# Patient Record
Sex: Female | Born: 1935 | Race: White | Smoking: Never smoker
Health system: Southern US, Community
[De-identification: ages and names within clinical notes are randomized; demographics above are authoritative.]

## PROBLEM LIST (undated history)

## (undated) DIAGNOSIS — E538 Deficiency of other specified B group vitamins: Secondary | ICD-10-CM

## (undated) DIAGNOSIS — M1990 Unspecified osteoarthritis, unspecified site: Secondary | ICD-10-CM

## (undated) DIAGNOSIS — N200 Calculus of kidney: Secondary | ICD-10-CM

## (undated) DIAGNOSIS — K559 Vascular disorder of intestine, unspecified: Secondary | ICD-10-CM

## (undated) DIAGNOSIS — F329 Major depressive disorder, single episode, unspecified: Secondary | ICD-10-CM

## (undated) DIAGNOSIS — R011 Cardiac murmur, unspecified: Secondary | ICD-10-CM

## (undated) DIAGNOSIS — I10 Essential (primary) hypertension: Secondary | ICD-10-CM

## (undated) DIAGNOSIS — E039 Hypothyroidism, unspecified: Secondary | ICD-10-CM

## (undated) DIAGNOSIS — K589 Irritable bowel syndrome without diarrhea: Secondary | ICD-10-CM

## (undated) DIAGNOSIS — E785 Hyperlipidemia, unspecified: Secondary | ICD-10-CM

## (undated) DIAGNOSIS — K219 Gastro-esophageal reflux disease without esophagitis: Secondary | ICD-10-CM

## (undated) DIAGNOSIS — F32A Depression, unspecified: Secondary | ICD-10-CM

## (undated) DIAGNOSIS — M7120 Synovial cyst of popliteal space [Baker], unspecified knee: Secondary | ICD-10-CM

## (undated) DIAGNOSIS — E559 Vitamin D deficiency, unspecified: Secondary | ICD-10-CM

## (undated) DIAGNOSIS — D126 Benign neoplasm of colon, unspecified: Secondary | ICD-10-CM

## (undated) DIAGNOSIS — G4700 Insomnia, unspecified: Secondary | ICD-10-CM

## (undated) DIAGNOSIS — G4730 Sleep apnea, unspecified: Secondary | ICD-10-CM

## (undated) DIAGNOSIS — M51369 Other intervertebral disc degeneration, lumbar region without mention of lumbar back pain or lower extremity pain: Secondary | ICD-10-CM

## (undated) DIAGNOSIS — M5136 Other intervertebral disc degeneration, lumbar region: Secondary | ICD-10-CM

## (undated) DIAGNOSIS — I639 Cerebral infarction, unspecified: Secondary | ICD-10-CM

## (undated) DIAGNOSIS — F419 Anxiety disorder, unspecified: Secondary | ICD-10-CM

## (undated) HISTORY — DX: Irritable bowel syndrome, unspecified: K58.9

## (undated) HISTORY — DX: Cerebral infarction, unspecified: I63.9

## (undated) HISTORY — DX: Vascular disorder of intestine, unspecified: K55.9

## (undated) HISTORY — DX: Deficiency of other specified B group vitamins: E53.8

## (undated) HISTORY — DX: Cardiac murmur, unspecified: R01.1

## (undated) HISTORY — DX: Calculus of kidney: N20.0

## (undated) HISTORY — DX: Vitamin D deficiency, unspecified: E55.9

## (undated) HISTORY — DX: Essential (primary) hypertension: I10

## (undated) HISTORY — DX: Benign neoplasm of colon, unspecified: D12.6

## (undated) HISTORY — DX: Major depressive disorder, single episode, unspecified: F32.9

## (undated) HISTORY — DX: Gastro-esophageal reflux disease without esophagitis: K21.9

## (undated) HISTORY — DX: Sleep apnea, unspecified: G47.30

## (undated) HISTORY — DX: Depression, unspecified: F32.A

## (undated) HISTORY — DX: Other intervertebral disc degeneration, lumbar region: M51.36

## (undated) HISTORY — DX: Unspecified osteoarthritis, unspecified site: M19.90

## (undated) HISTORY — DX: Synovial cyst of popliteal space (Baker), unspecified knee: M71.20

## (undated) HISTORY — DX: Insomnia, unspecified: G47.00

## (undated) HISTORY — PX: VAGINAL HYSTERECTOMY: SUR661

## (undated) HISTORY — DX: Hypothyroidism, unspecified: E03.9

## (undated) HISTORY — DX: Other intervertebral disc degeneration, lumbar region without mention of lumbar back pain or lower extremity pain: M51.369

## (undated) HISTORY — PX: COLONOSCOPY: SHX174

## (undated) HISTORY — DX: Hyperlipidemia, unspecified: E78.5

## (undated) HISTORY — DX: Anxiety disorder, unspecified: F41.9

## (undated) HISTORY — PX: CHOLECYSTECTOMY: SHX55

## (undated) NOTE — *Deleted (*Deleted)
Objective Swallowing Evaluation: Type of Study: MBS-Modified Barium Swallow Study   Patient Details  Name: Charlene Lopez MRN: [id number] Date of Birth: Mar 01, 1935  Today's Date: 04/07/2020 Time: SLP Start Time (ACUTE ONLY): 1025 -SLP Stop Time (ACUTE ONLY): 1101  SLP Time Calculation (min) (ACUTE ONLY): 36 min   Past Medical History:  Past Medical History:  Diagnosis Date  . Adenomatous colon polyp   . Anxiety   . Baker cyst   . DDD (degenerative disc disease), lumbar   . Depression (emotion)   . GERD (gastroesophageal reflux disease)   . Heart murmur   . Hyperlipidemia   . Hypertension   . Hypothyroidism   . IBS (irritable bowel syndrome)   . Insomnia   . Ischemic colitis (HCC)   . Nephrolithiasis   . Osteoarthritis   . Sleep apnea    Mild- uses CPAP  . Stroke (HCC) [id number]  . Vitamin B12 deficiency   . Vitamin D deficiency    Past Surgical History:  Past Surgical History:  Procedure Laterality Date  . CHOLECYSTECTOMY    . COLONOSCOPY    . PARTIAL COLECTOMY  08/01/2011  . VAGINAL HYSTERECTOMY     HPI: 58 yo female with h/o nonspecific esophageal motility disorder, CVAs in 2016 and 2017, depression, IBS referred by Dr Russella Dar for OP MBSS.  Pt has undergone prior MBS 06/08/2017 showing flash penetration of liquids and functional oropharyngeal swallow ability for her age.  Per Dr Ardell Isaacs office note, pt reports increasing problems with swallowing with liquids causing choking/coughing as well as difficulties swallowing large pills.  Intermittent difficulty with solids per MD note.  Dysphagia symptoms have increased in intensity and frequency in the last year.    Pt states she will "strangle" on  saliva and drink - but states occurs more with saliva.  She also complains of  having retention of phlegm in throat when waking that she can't clear.  Denies weight loss, pnas, nor requiring heimlich   Subjective: pt awake in chair    Assessment / Plan / Recommendation  CHL  IP CLINICAL IMPRESSIONS 04/07/2020  Clinical Impression Pt presents with functional oropharyngeal swallow ability without aspiration or penetration of any consistency tested with head neutral position.  Chin tuck posture did allow aspiration of thin from retained thin on superior side of epiglottis spilling into open airway.  This trace aspiration did produce a reflexive cough.  Pharyngeal swallow is strong without retention with head neutral.  Cervical osteophyte appears present at C3-C4 but did not impair epigllotic deflection.  Given pt report of pill dysphagia but denial of difficulties with pudding - advised she take pills with pudding *start and follow with liquids.  If drinks with slight increased of thickeness prevents cough, advised shde consume them with meals but reviewed importance of hydration/water consumption.  Xerostomia reported thus SLP provided compensations to pt in writing. Thanks for this referral.   SLP Visit Diagnosis Dysphagia, unspecified (R13.10)  Attention and concentration deficit following --  Frontal lobe and executive function deficit following --  Impact on safety and function --      CHL IP TREATMENT RECOMMENDATION 06/08/2017  Treatment Recommendations No treatment recommended at this time     No flowsheet data found.  CHL IP DIET RECOMMENDATION 04/07/2020  SLP Diet Recommendations Regular solids;Thin liquid  Liquid Administration via Cup;Straw  Medication Administration Whole meds with puree  Compensations Slow rate;Small sips/bites  Postural Changes Remain semi-upright after after feeds/meals (Comment);Seated upright at 90 degrees  CHL IP OTHER RECOMMENDATIONS 06/08/2017  Recommended Consults --  Oral Care Recommendations Oral care BID  Other Recommendations --      No flowsheet data found.    No flowsheet data found.         CHL IP ORAL PHASE 04/07/2020  Oral Phase Impaired  Oral - Pudding Teaspoon --  Oral - Pudding Cup --  Oral - Honey  Teaspoon --  Oral - Honey Cup --  Oral - Nectar Teaspoon --  Oral - Nectar Cup WFL  Oral - Nectar Straw WFL  Oral - Thin Teaspoon --  Oral - Thin Cup WFL  Oral - Thin Straw WFL  Oral - Puree WFL  Oral - Mech Soft WFL  Oral - Regular --  Oral - Multi-Consistency --  Oral - Pill --  Oral Phase - Comment --    CHL IP PHARYNGEAL PHASE 04/07/2020  Pharyngeal Phase Impaired  Pharyngeal- Pudding Teaspoon --  Pharyngeal --  Pharyngeal- Pudding Cup --  Pharyngeal --  Pharyngeal- Honey Teaspoon --  Pharyngeal --  Pharyngeal- Honey Cup --  Pharyngeal --  Pharyngeal- Nectar Teaspoon --  Pharyngeal --  Pharyngeal- Nectar Cup [facility]  Pharyngeal Material does not enter airway  Pharyngeal- Nectar Straw WFL  Pharyngeal Material does not enter airway  Pharyngeal- Thin Teaspoon --  Pharyngeal --  Pharyngeal- Thin Cup [facility]  Pharyngeal Material does not enter airway  Pharyngeal- Thin Straw WFL;Penetration/Apiration after swallow  Pharyngeal Material does not enter airway;Material enters airway, passes BELOW cords and not ejected out despite cough attempt by patient  Pharyngeal- Puree WFL  Pharyngeal Material does not enter airway  Pharyngeal- Mechanical Soft WFL  Pharyngeal Material does not enter airway  Pharyngeal- Regular --  Pharyngeal --  Pharyngeal- Multi-consistency --  Pharyngeal --  Pharyngeal- Pill --  Pharyngeal --  Pharyngeal Comment Chin tuck posture tested to see if would possibly decrease episodes of penetration/aspiration however this posture allowed aspiration of thin due to epiglottic retention spill into airway after the swallow.       CHL IP CERVICAL ESOPHAGEAL PHASE 04/07/2020  Cervical Esophageal Phase WFL  Pudding Teaspoon --  Pudding Cup --  Honey Teaspoon --  Honey Cup --  Nectar Teaspoon --  Nectar Cup --  Nectar Straw --  Thin Teaspoon --  Thin Cup --  Thin Straw --  Puree --  Mechanical Soft --  Regular --  Multi-consistency --  Pill --  Cervical  Esophageal Comment --     Charlene Lopez 04/07/2020, 11:23 AM                 Rolena Infante, MS [facility] SLP Acute Rehab Services Office [phone] Pager [phone]

---

## 2011-08-01 HISTORY — PX: PARTIAL COLECTOMY: SHX5273

## 2017-03-23 ENCOUNTER — Encounter: Payer: Self-pay | Admitting: Gastroenterology

## 2017-05-15 ENCOUNTER — Encounter: Payer: Self-pay | Admitting: Gastroenterology

## 2017-05-15 ENCOUNTER — Ambulatory Visit (INDEPENDENT_AMBULATORY_CARE_PROVIDER_SITE_OTHER): Payer: Medicare Other | Admitting: Gastroenterology

## 2017-05-15 ENCOUNTER — Encounter (INDEPENDENT_AMBULATORY_CARE_PROVIDER_SITE_OTHER): Payer: Self-pay

## 2017-05-15 VITALS — BP 122/58 | HR 66 | Ht 69.0 in | Wt 180.1 lb

## 2017-05-15 DIAGNOSIS — R1310 Dysphagia, unspecified: Secondary | ICD-10-CM

## 2017-05-15 DIAGNOSIS — Z7901 Long term (current) use of anticoagulants: Secondary | ICD-10-CM | POA: Diagnosis not present

## 2017-05-15 DIAGNOSIS — Z86010 Personal history of colonic polyps: Secondary | ICD-10-CM | POA: Diagnosis not present

## 2017-05-15 DIAGNOSIS — K580 Irritable bowel syndrome with diarrhea: Secondary | ICD-10-CM

## 2017-05-15 DIAGNOSIS — R197 Diarrhea, unspecified: Secondary | ICD-10-CM

## 2017-05-15 DIAGNOSIS — Z8673 Personal history of transient ischemic attack (TIA), and cerebral infarction without residual deficits: Secondary | ICD-10-CM | POA: Diagnosis not present

## 2017-05-15 MED ORDER — GLYCOPYRROLATE 1 MG PO TABS: 1.0000 mg | ORAL_TABLET | Freq: Two times a day (BID) | ORAL | 3 refills | Status: DC

## 2017-05-15 NOTE — Progress Notes (Signed)
[id number]   /   History of Present Illness: This is an 82 year old female referred by Charlene Mallet, MD for the evaluation of intermittent diarrhea, occasional fecal incontinence, occasional lower abdominal pain and a personal history of colon polyps.  She is accompanied by her daughter.  She previously had GI care in [place] by Dr. West Carbo, Dr. Docia Furl in [place] and more recently by Dr. Kathryne Eriksson in [place].  Reviewed extensive records.  She has a history of adenomatous colon polyps.  One polyp was removed piecemeal from the right colon.  A larger villous adenoma was removed by segmental colectomy in her left colon.  Her most recent colonoscopy was performed in February 2016 with no polyps noted.  Unfortunately she suffered a small CVA following her colonoscopy.  She suffered another CVA about 1 year later and was then placed on Plavix.  She relates problems with irritable bowel syndrome for over 35 years and has normal bowel movements with occasional urgent diarrhea and occasional mild constipation.  She has occasional mild lower abdominal pain.  About 5 times over the past several months she has had fecal incontinence with urgent, watery diarrhea.  She takes Librax as needed, infrequently, which does help her symptoms.  She relates intermittent difficulty swallowing since her CVAs. She chokes occasionally during swallowing, most frequently with liquids.  She localizes the symptoms to the base of her neck. Denies weight loss, change in stool caliber, melena, hematochezia, nausea, vomiting, reflux symptoms, chest pain.   Allergies  Allergen Reactions  . Ciprofloxacin    Outpatient Medications Prior to Visit  Medication Sig Dispense Refill  . amLODipine (NORVASC) 5 MG tablet Take 2.5 mg by mouth daily.     Marland Kitchen atenolol (TENORMIN) 25 MG tablet Take 25 mg by mouth daily.    . Cholecalciferol (VITAMIN D3) 2000 units TABS Take 1 tablet by mouth daily.    . clidinium-chlordiazePOXIDE (LIBRAX)  5-2.5 MG capsule Take 1 capsule by mouth 3 (three) times daily before meals.    . clopidogrel (PLAVIX) 75 MG tablet Take 75 mg by mouth daily.    Marland Kitchen L-Methylfolate-B6-B12 (FOLTANX) 3-35-2 MG TABS Take 1 tablet by mouth 2 (two) times daily.    Marland Kitchen LORazepam (ATIVAN) 0.5 MG tablet Take 0.5 mg by mouth at bedtime.    Marland Kitchen losartan-hydrochlorothiazide (HYZAAR) 100-25 MG tablet Take 1 tablet by mouth daily.    Marland Kitchen PARoxetine (PAXIL) 20 MG tablet Take 20 mg by mouth daily.    . promethazine (PHENERGAN) 25 MG tablet Take 25 mg by mouth every 6 (six) hours as needed for nausea or vomiting.    . simvastatin (ZOCOR) 20 MG tablet Take 20 mg by mouth daily.    . Vitamin D, Ergocalciferol, (DRISDOL) 50000 units CAPS capsule Take 50,000 Units by mouth every 7 (seven) days.    Marland Kitchen aspirin EC 81 MG tablet Take 81 mg by mouth daily.    Marland Kitchen atorvastatin (LIPITOR) 40 MG tablet Take 40 mg by mouth daily.    . baclofen (LIORESAL) 10 MG tablet Take 10 mg by mouth 3 (three) times daily.    . calcium carbonate (OSCAL) 1500 (600 Ca) MG TABS tablet Take 1,500 mg by mouth daily with breakfast.     No facility-administered medications prior to visit.    Past Medical History:  Diagnosis Date  . Adenomatous colon polyp   . Anxiety   . Baker cyst   . DDD (degenerative disc disease), lumbar   . Depression (emotion)   .  GERD (gastroesophageal reflux disease)   . Hyperlipidemia   . Hypertension   . Hypothyroidism   . IBS (irritable bowel syndrome)   . Insomnia   . Ischemic colitis ([place])   . Nephrolithiasis   . Osteoarthritis   . Vitamin B12 deficiency   . Vitamin D deficiency    Past Surgical History:  Procedure Laterality Date  . CHOLECYSTECTOMY    . PARTIAL COLECTOMY  08/01/2011  . VAGINAL HYSTERECTOMY     Social History   Socioeconomic History  . Marital status: Widowed    Spouse name: None  . Number of children: None  . Years of education: None  . Highest education level: None  Social Needs  . Financial  resource strain: None  . Food insecurity - worry: None  . Food insecurity - inability: None  . Transportation needs - medical: None  . Transportation needs - non-medical: None  Occupational History  . None  Tobacco Use  . Smoking status: Never Smoker  . Smokeless tobacco: Never Used  Substance and Sexual Activity  . Alcohol use: No    Frequency: Never  . Drug use: No  . Sexual activity: None  Other Topics Concern  . None  Social History Narrative  . None   Family History  Problem Relation Age of Onset  . Pneumonia Mother   . Diabetes type II Father   . Ovarian cancer Sister   . CVA Sister       Review of Systems: Pertinent positive and negative review of systems were noted in the above HPI section. All other review of systems were otherwise negative.   Physical Exam: General: Well developed, well nourished, no acute distress Head: Normocephalic and atraumatic Eyes:  sclerae anicteric, EOMI Ears: Normal auditory acuity Mouth: No deformity or lesions Neck: Supple, no masses or thyromegaly Lungs: Clear throughout to auscultation Heart: Regular rate and rhythm; no murmurs, rubs or bruits Abdomen: Soft, non tender and non distended. No masses, hepatosplenomegaly or hernias noted. Normal Bowel sounds Rectal: not done Musculoskeletal: Symmetrical with no gross deformities  Skin: No lesions on visible extremities Pulses:  Normal pulses noted Extremities: No clubbing, cyanosis, edema or deformities noted Neurological: Alert oriented x 4, grossly nonfocal Cervical Nodes:  No significant cervical adenopathy Inguinal Nodes: No significant inguinal adenopathy Psychological:  Alert and cooperative. Normal mood and affect  Assessment and Recommendations:  1.  IBS with alternating pattern.  Urgent diarrhea with occasional fecal incontinence is her most bothersome symptom.  Colonoscopy in February 2016 was normal.  Begin glycopyrrolate 1 mg twice daily.  May use Librax 3 times  daily as needed for breakthrough symptoms.  If fecal incontinence persists consider anorectal manometry.  2.  Oropharyngeal favored over esophageal dysphagia.  Schedule modified barium swallow study and barium esophagram with a tablet.  If an esophageal stricture or other significant abnormality on barium esophagram indicates the need for an upper endoscopy with possible biopsy and possible dilation the patient and her daughter will need to further consider the risks-benefits given her need for Plavix and her prior CVAs.  3. Personal history of adenomatous colon polyps one requiring piecemeal polypectomy and one requiring a left segmental colectomy.  Last colonoscopy in 2016 was normal. It is reasonable to consider discontinuing surveillance colonoscopies however given her history of large precancerous polyps it is reasonable to proceed with colonoscopy.  Given her age and history of CVAs the patient and her daughter will need to further consider the risks- benefits given her need  for Plavix. Her previous gastroenterologist had recommended a 3-year interval colonoscopy which would be due in February 2019.  4.  History of CVA maintained on Plavix.  Although a procedure was not scheduled today we  discussed in detail the risk benefits and alternatives to holding Plavix for 5 days prior to the procedure and that we would obtain clearance from her prescribing physician if we proceed with any procedure.   cc: Charlene Mallet, MD [address] [place] [address]

## 2017-05-15 NOTE — Patient Instructions (Signed)
We have sent the following medications to your pharmacy for you to pick up at your convenience: Glycopyrolate 1 mg twice a day    You have been scheduled for a modified barium swallow on 06/08/17 at 1 pm. Please arrive 15 minutes prior to your test for registration. You will go to [facility] Radiology (1st Floor) for your appointment. Should you need to cancel or reschedule your appointment, please contact [phone] [place] [place]) or [phone] [place]). _____________________________________________________________________ A Modified Barium Swallow Study, or MBS, is a special x-ray that is taken to check swallowing skills. It is carried out by a Stage manager and a Psychologist, clinical (SLP). During this test, yourmouth, throat, and esophagus, a muscular tube which connects your mouth to your stomach, is checked. The test will help you, your doctor, and the SLP plan what types of foods and liquids are easier for you to swallow. The SLP will also identify positions and ways to help you swallow more easily and safely. What will happen during an MBS? You will be taken to an x-ray room and seated comfortably. You will be asked to swallow small amounts of food and liquid mixed with barium. Barium is a liquid or paste that allows images of your mouth, throat and esophagus to be seen on x-ray. The x-ray captures moving images of the food you are swallowing as it travels from your mouth through your throat and into your esophagus. This test helps identify whether food or liquid is entering your lungs (aspiration). The test also shows which part of your mouth or throat lacks strength or coordination to move the food or liquid in the right direction. This test typically takes 30 minutes to 1 hour to complete. _______________________________________________________________________

## 2017-05-18 ENCOUNTER — Other Ambulatory Visit (HOSPITAL_COMMUNITY): Payer: Self-pay | Admitting: Gastroenterology

## 2017-05-18 DIAGNOSIS — R1310 Dysphagia, unspecified: Secondary | ICD-10-CM

## 2017-06-08 ENCOUNTER — Ambulatory Visit (HOSPITAL_COMMUNITY)
Admission: RE | Admit: 2017-06-08 | Discharge: 2017-06-08 | Disposition: A | Discharge status: Home / Self Care | Payer: Medicare Other | Source: Ambulatory Visit | Attending: Gastroenterology | Admitting: Gastroenterology

## 2017-06-08 DIAGNOSIS — R1310 Dysphagia, unspecified: Secondary | ICD-10-CM | POA: Insufficient documentation

## 2017-06-08 DIAGNOSIS — K224 Dyskinesia of esophagus: Secondary | ICD-10-CM | POA: Diagnosis not present

## 2017-06-08 NOTE — Progress Notes (Signed)
Modified Barium Swallow Progress Note  Patient Details  Name: Jakelin Taussig MRN: [id number] Date of Birth: January 27, 1935  Today's Date: 06/08/2017  Modified Barium Swallow completed.  Full report located under Chart Review in the Imaging Section.  Brief recommendations include the following:  Clinical Impression  Pt demosntrates a normal oropharyngeal swallow response for age. Occasional flash penetration events noted, not outside of normal limits. Pt may have occasional sensed penetration events, though none observed on this test. Recommend pt continue current diet, no SLP f/u needed.    Swallow Evaluation Recommendations       SLP Diet Recommendations: Regular solids;Thin liquid   Liquid Administration via: Cup;Straw   Medication Administration: Whole meds with liquid   Supervision: Patient able to self feed       Postural Changes: Seated upright at 90 degrees   Oral Care Recommendations: Oral care BID        Carlito Bogert, Katherene Ponto 06/08/2017,2:08 PM

## 2017-11-17 ENCOUNTER — Telehealth: Payer: Self-pay | Admitting: Gastroenterology

## 2017-11-17 NOTE — Telephone Encounter (Signed)
Talk to pt and advice to call back when sep schd is available

## 2017-11-17 NOTE — Telephone Encounter (Signed)
Needs OV to discuss.  Not a direct

## 2018-02-12 ENCOUNTER — Telehealth: Payer: Self-pay

## 2018-02-12 ENCOUNTER — Encounter: Payer: Self-pay | Admitting: Gastroenterology

## 2018-02-12 ENCOUNTER — Ambulatory Visit (INDEPENDENT_AMBULATORY_CARE_PROVIDER_SITE_OTHER): Payer: Medicare Other | Admitting: Gastroenterology

## 2018-02-12 VITALS — BP 130/68 | HR 74 | Ht 65.0 in | Wt 178.4 lb

## 2018-02-12 DIAGNOSIS — Z7902 Long term (current) use of antithrombotics/antiplatelets: Secondary | ICD-10-CM | POA: Diagnosis not present

## 2018-02-12 DIAGNOSIS — Z86010 Personal history of colonic polyps: Secondary | ICD-10-CM | POA: Diagnosis not present

## 2018-02-12 NOTE — Progress Notes (Signed)
History of Present Illness: This is an 83 year old female self referred for the evaluation of a personal history of adenomatous colon polyps, maintained on Plavix.  She is accompanied by her daughter.  See my 05/15/2017 office note.  One polyp required a left segmental colectomy and one polyp required piecemeal polypectomy.  Her last colonoscopy in was in 06/2014 was normal.  Unfortunately she suffered a small CVA following her last colonoscopy.  She suffered another CVA 1 year later and was placed on Plavix.  Her previous gastroenterologist recommended a 3-year interval colonoscopy.  She has intermittent irritable bowel syndrome flares with urgent diarrhea and crampy abdominal pain.  Symptoms are helped with Librax.  She has had mild dysphasia primarily with liquids since her CVAs which has not changed. Denies weight loss, constipation, change in stool caliber, melena, hematochezia, nausea, vomiting, reflux symptoms, chest pain.    Allergies  Allergen Reactions  . Ciprofloxacin    Outpatient Medications Prior to Visit  Medication Sig Dispense Refill  . amLODipine (NORVASC) 5 MG tablet Take 2.5 mg by mouth daily.     Marland Kitchen atenolol (TENORMIN) 25 MG tablet Take 25 mg by mouth daily.    . Cholecalciferol (VITAMIN D3) 2000 units TABS Take 1 tablet by mouth daily.    . clidinium-chlordiazePOXIDE (LIBRAX) 5-2.5 MG capsule Take 1 capsule by mouth 3 (three) times daily before meals.    . clopidogrel (PLAVIX) 75 MG tablet Take 75 mg by mouth daily.    Marland Kitchen L-Methylfolate-B6-B12 (FOLTANX) 3-35-2 MG TABS Take 1 tablet by mouth 2 (two) times daily.    Marland Kitchen LORazepam (ATIVAN) 0.5 MG tablet Take 0.5 mg by mouth at bedtime.    Marland Kitchen losartan-hydrochlorothiazide (HYZAAR) 100-25 MG tablet Take 1 tablet by mouth daily.    Marland Kitchen PARoxetine (PAXIL) 20 MG tablet Take 20 mg by mouth daily.    . promethazine (PHENERGAN) 25 MG tablet Take 25 mg by mouth every 6 (six) hours as needed for nausea or vomiting.    . simvastatin (ZOCOR)  20 MG tablet Take 20 mg by mouth daily.    . Vitamin D, Ergocalciferol, (DRISDOL) 50000 units CAPS capsule Take 50,000 Units by mouth every 7 (seven) days.    Marland Kitchen glycopyrrolate (ROBINUL) 1 MG tablet Take 1 tablet (1 mg total) by mouth 2 (two) times daily. 60 tablet 3   No facility-administered medications prior to visit.    Past Medical History:  Diagnosis Date  . Adenomatous colon polyp   . Anxiety   . Baker cyst   . DDD (degenerative disc disease), lumbar   . Depression (emotion)   . GERD (gastroesophageal reflux disease)   . Hyperlipidemia   . Hypertension   . Hypothyroidism   . IBS (irritable bowel syndrome)   . Insomnia   . Ischemic colitis ([place])   . Nephrolithiasis   . Osteoarthritis   . Stroke ([place])   . Vitamin B12 deficiency   . Vitamin D deficiency    Past Surgical History:  Procedure Laterality Date  . CHOLECYSTECTOMY    . PARTIAL COLECTOMY  08/01/2011  . VAGINAL HYSTERECTOMY     Social History   Socioeconomic History  . Marital status: Widowed    Spouse name: Not on file  . Number of children: Not on file  . Years of education: Not on file  . Highest education level: Not on file  Occupational History  . Not on file  Social Needs  . Financial resource strain: Not on file  .  Food insecurity:    Worry: Not on file    Inability: Not on file  . Transportation needs:    Medical: Not on file    Non-medical: Not on file  Tobacco Use  . Smoking status: Never Smoker  . Smokeless tobacco: Never Used  Substance and Sexual Activity  . Alcohol use: No    Frequency: Never  . Drug use: No  . Sexual activity: Not on file  Lifestyle  . Physical activity:    Days per week: Not on file    Minutes per session: Not on file  . Stress: Not on file  Relationships  . Social connections:    Talks on phone: Not on file    Gets together: Not on file    Attends religious service: Not on file    Active member of club or organization: Not on file    Attends meetings of  clubs or organizations: Not on file    Relationship status: Not on file  Other Topics Concern  . Not on file  Social History Narrative  . Not on file   Family History  Problem Relation Age of Onset  . Pneumonia Mother   . Diabetes type II Father   . Ovarian cancer Sister   . CVA Sister        Review of Systems: Pertinent positive and negative review of systems were noted in the above HPI section. All other review of systems were otherwise negative.    Physical Exam: General: Well developed, well nourished, no acute distress Head: Normocephalic and atraumatic Eyes:  sclerae anicteric, EOMI Ears: Normal auditory acuity Mouth: No deformity or lesions Neck: Supple, no masses or thyromegaly Lungs: Clear throughout to auscultation Heart: Regular rate and rhythm; no murmurs, rubs or bruits Abdomen: Soft, non tender and non distended. No masses, hepatosplenomegaly or hernias noted. Normal Bowel sounds Rectal: deferred to colonoscopy Musculoskeletal: Symmetrical with no gross deformities  Skin: No lesions on visible extremities Pulses:  Normal pulses noted Extremities: No clubbing, cyanosis, edema or deformities noted Neurological: Alert oriented x 4, grossly nonfocal Cervical Nodes:  No significant cervical adenopathy Inguinal Nodes: No significant inguinal adenopathy Psychological:  Alert and cooperative. Normal mood and affect   Assessment and Recommendations:  1.  Personal history of adenomatous colon polyps, one requiring piecemeal polypectomy and one requiring a left segmental colectomy.  We discussed the options of performing colonoscopy while on Plavix, performing colonoscopy off Plavix for 5 days and deferring colonoscopy given her age and her last colonoscopy did not show polyps.  After further discussion the patient would like to proceed with colonoscopy off Plavix.  She is advised to begin aspirin 81 mg po qd 3 days prior to discontinuing Plavix and to continue aspirin  until Plavix is restarted following colonoscopy.  The risks (includin weight g bleeding, perforation, infection, missed lesions, medication reactions and possible hospitalization or surgery if complications occur), benefits, and alternatives to colonoscopy with possible biopsy and possible polypectomy were discussed with the patient and they consent to proceed.   2. IBS.  Continue Librax 3 times daily as needed.  Avoid foods that trigger diarrhea.  3.  History of CVA maintained on Plavix.  Hold Plavix 5 days before procedure - will instruct when and how to resume after procedure. Low but real risk of cardiovascular event such as heart attack, stroke, embolism, thrombosis or ischemia/infarct of other organs off Plavix explained and need to seek urgent help if this occurs. The patient consents to  proceed. Will communicate by phone or EMR with patient's prescribing provider to confirm that holding Plavix is reasonable in this case.

## 2018-02-12 NOTE — Patient Instructions (Signed)
It has been recommended to you by your physician that you have a(n) Colonoscopy completed. Per your request, we did not schedule the procedure(s) today since we did not have a morning time. Please contact our office at [phone] in a week or two to check on November schedule.   Dr. Fuller Plan would like you to start a 81 mg aspirin 3 days before stopping your Plavix. We will go ahead and send a clearance letter to your Neurologist to get approval for holding Plavix prior to your Colonoscopy.   Thank you for choosing me and [facility].  Pricilla Riffle. Dagoberto Ligas., MD., Mar[facility] Regal

## 2018-02-12 NOTE — Telephone Encounter (Signed)
   Charlene Lopez 10/22/1934 [id number]  Dear Casandra Doffing:  We have scheduled the above named patient for a(n) Colonoscopy procedure. Our records show that (s)he is on anticoagulation therapy.  Please advise as to whether the patient may come off their therapy of Plavix 5 days prior to their procedure which is scheduled for November.  Please route your response to Marlon Pel, CMA or fax response to [phone].  Sincerely,    [facility]

## 2018-02-19 ENCOUNTER — Encounter: Payer: Self-pay | Admitting: Gastroenterology

## 2018-02-21 NOTE — Telephone Encounter (Signed)
Per Dr. Casandra Doffing patient can hold Plavix 5 days prior to her procedure. Patient's daughter Shirlean Mylar) notified for patient to hold Plavix per Dr. Casandra Doffing and Shirlean Mylar verbalized understanding.

## 2018-03-21 ENCOUNTER — Ambulatory Visit (AMBULATORY_SURGERY_CENTER): Payer: Self-pay | Admitting: *Deleted

## 2018-03-21 VITALS — Ht 69.0 in | Wt 179.0 lb

## 2018-03-21 DIAGNOSIS — Z86010 Personal history of colonic polyps: Secondary | ICD-10-CM

## 2018-03-21 MED ORDER — NA SULFATE-K SULFATE-MG SULF 17.5-3.13-1.6 GM/177ML PO SOLN: ORAL | 0 refills | Status: DC

## 2018-03-21 NOTE — Progress Notes (Signed)
Daughter with patient during [place] today. Patient denies any allergies to eggs or soy. Patient denies any problems with anesthesia/sedation. Patient denies any oxygen use at home. Patient denies taking any diet/weight loss medications. EMMI education offered, pt declined.

## 2018-04-02 ENCOUNTER — Encounter: Payer: Self-pay | Admitting: Gastroenterology

## 2018-04-02 ENCOUNTER — Ambulatory Visit (AMBULATORY_SURGERY_CENTER): Payer: Medicare Other | Admitting: Gastroenterology

## 2018-04-02 VITALS — BP 159/66 | HR 59 | Temp 99.1°F | Resp 10 | Ht 65.0 in | Wt 178.0 lb

## 2018-04-02 DIAGNOSIS — D125 Benign neoplasm of sigmoid colon: Secondary | ICD-10-CM | POA: Diagnosis not present

## 2018-04-02 DIAGNOSIS — D123 Benign neoplasm of transverse colon: Secondary | ICD-10-CM | POA: Diagnosis not present

## 2018-04-02 DIAGNOSIS — D124 Benign neoplasm of descending colon: Secondary | ICD-10-CM

## 2018-04-02 DIAGNOSIS — Z860101 Personal history of adenomatous and serrated colon polyps: Secondary | ICD-10-CM

## 2018-04-02 DIAGNOSIS — Z86010 Personal history of colonic polyps: Secondary | ICD-10-CM

## 2018-04-02 MED ORDER — SODIUM CHLORIDE 0.9 % IV SOLN: 500.0000 mL | Freq: Once | INTRAVENOUS | Status: DC

## 2018-04-02 NOTE — Op Note (Signed)
[facility] Patient Name: Charlene Lopez Procedure Date: 04/02/2018 9:10 AM MRN: [id number] Endoscopist: Ladene Artist , MD Age: 83 Referring MD:  Date of Birth: Oct 05, 1934 Gender: Female Account #: [id number] Procedure:                Colonoscopy Indications:              Surveillance: Personal history of adenomatous                            polyps on last colonoscopy 3 years ago Medicines:                Monitored Anesthesia Care Procedure:                Pre-Anesthesia Assessment:                           - Prior to the procedure, a History and Physical                            was performed, and patient medications and                            allergies were reviewed. The patient's tolerance of                            previous anesthesia was also reviewed. The risks                            and benefits of the procedure and the sedation                            options and risks were discussed with the patient.                            All questions were answered, and informed consent                            was obtained. Prior Anticoagulants: The patient has                            taken Plavix (clopidogrel), last dose was 5 days                            prior to procedure. ASA Grade Assessment: III - A                            patient with severe systemic disease. After                            reviewing the risks and benefits, the patient was                            deemed in satisfactory condition to undergo the  procedure.                           After obtaining informed consent, the colonoscope                            was passed under direct vision. Throughout the                            procedure, the patient's blood pressure, pulse, and                            oxygen saturations were monitored continuously. The                            Colonoscope was introduced through the anus and                        advanced to the the cecum, identified by                            appendiceal orifice and ileocecal valve. The                            ileocecal valve, appendiceal orifice, and rectum                            were photographed. The quality of the bowel                            preparation was adequate. The colonoscopy was                            performed without difficulty. The patient tolerated                            the procedure well. Scope In: 9:17:08 AM Scope Out: 9:42:25 AM Scope Withdrawal Time: 0 hours 16 minutes 53 seconds  Total Procedure Duration: 0 hours 25 minutes 17 seconds  Findings:                 The perianal and digital rectal examinations were                            normal.                           Four sessile polyps were found in the sigmoid colon                            (2), descending colon (1) and transverse colon (1).                            The polyps were 6 to 8 mm in size. These polyps  were removed with a cold snare. Resection and                            retrieval were complete.                           There was evidence of a prior end-to-end                            colo-colonic anastomosis in the descending colon.                            This was patent and was characterized by healthy                            appearing mucosa. The anastomosis was traversed.                           Multiple small-mouthed diverticula were found in                            the left colon. There was no evidence of                            diverticular bleeding.                           Internal hemorrhoids were found during                            retroflexion. The hemorrhoids were small and Grade                            I (internal hemorrhoids that do not prolapse).                           The exam was otherwise without abnormality on                            direct and  retroflexion views. Complications:            No immediate complications. Estimated blood loss:                            None. Estimated Blood Loss:     Estimated blood loss: none. Impression:               - Four 6 to 8 mm polyps in the sigmoid colon, in                            the descending colon and in the transverse colon,                            removed with a cold snare. Resected and retrieved.                           -  Patent end-to-end colo-colonic anastomosis,                            characterized by healthy appearing mucosa.                           - Mild diverticulosis in the left colon.                           - Internal hemorrhoids.                           - The examination was otherwise normal on direct                            and retroflexion views. Recommendation:           - Resume Plavix (clopidogrel) tomorrow at prior                            dose. Refer to managing physician for further                            adjustment of therapy.                           - Continue ASA 81 mg po qd for 5 days then                            discontinue.                           - Patient has a contact number available for                            emergencies. The signs and symptoms of potential                            delayed complications were discussed with the                            patient. Return to normal activities tomorrow.                            Written discharge instructions were provided to the                            patient.                           - High fiber diet.                           - Continue present medications.                           - Await pathology results.                           -  No repeat colonoscopy due to age, comorbidities. Ladene Artist, MD 04/02/2018 9:47:43 AM This report has been signed electronically.

## 2018-04-02 NOTE — Patient Instructions (Signed)
*   handout on polyps, diverticulosis and hemorrhoids given. Resume Plavix tomorrow (04/03/18) at prior dose. Continue 81mg  Aspirin for five days.  YOU HAD AN ENDOSCOPIC PROCEDURE TODAY AT [facility]:   Refer to the procedure report that was given to you for any specific questions about what was found during the examination.  If the procedure report does not answer your questions, please call your gastroenterologist to clarify.  If you requested that your care partner not be given the details of your procedure findings, then the procedure report has been included in a sealed envelope for you to review at your convenience later.  YOU SHOULD EXPECT: Some feelings of bloating in the abdomen. Passage of more gas than usual.  Walking can help get rid of the air that was put into your GI tract during the procedure and reduce the bloating. If you had a lower endoscopy (such as a colonoscopy or flexible sigmoidoscopy) you may notice spotting of blood in your stool or on the toilet paper. If you underwent a bowel prep for your procedure, you may not have a normal bowel movement for a few days.  Please Note:  You might notice some irritation and congestion in your nose or some drainage.  This is from the oxygen used during your procedure.  There is no need for concern and it should clear up in a day or so.  SYMPTOMS TO REPORT IMMEDIATELY:   Following lower endoscopy (colonoscopy or flexible sigmoidoscopy):  Excessive amounts of blood in the stool  Significant tenderness or worsening of abdominal pains  Swelling of the abdomen that is new, acute  Fever of 100F or higher   For urgent or emergent issues, a gastroenterologist can be reached at any hour by calling [phone].   DIET:  We do recommend a small meal at first, but then you may proceed to your regular diet.  Drink plenty of fluids but you should avoid alcoholic beverages for 24 hours.  ACTIVITY:  You should plan to take it  easy for the rest of today and you should NOT DRIVE or use heavy machinery until tomorrow (because of the sedation medicines used during the test).    FOLLOW UP: Our staff will call the number listed on your records the next business day following your procedure to check on you and address any questions or concerns that you may have regarding the information given to you following your procedure. If we do not reach you, we will leave a message.  However, if you are feeling well and you are not experiencing any problems, there is no need to return our call.  We will assume that you have returned to your regular daily activities without incident.  If any biopsies were taken you will be contacted by phone or by letter within the next 1-3 weeks.  Please call us at [phone] if you have not heard about the biopsies in 3 weeks.    SIGNATURES/CONFIDENTIALITY: You and/or your care partner have signed paperwork which will be entered into your electronic medical record.  These signatures attest to the fact that that the information above on your After Visit Summary has been reviewed and is understood.  Full responsibility of the confidentiality of this discharge information lies with you and/or your care-partner.

## 2018-04-02 NOTE — Progress Notes (Signed)
Called to room to assist during endoscopic procedure.  Patient ID and intended procedure confirmed with present staff. Received instructions for my participation in the procedure from the performing physician.  

## 2018-04-02 NOTE — Progress Notes (Signed)
Pt's states no medical or surgical changes since previsit or office visit. 

## 2018-04-02 NOTE — Progress Notes (Signed)
Report to PACU, RN, vss, BBS= Clear.  

## 2018-04-03 ENCOUNTER — Telehealth: Payer: Self-pay

## 2018-04-03 NOTE — Telephone Encounter (Signed)
  Follow up Call-  Call back number 04/02/2018  Post procedure Call Back phone  # [phone]  Permission to leave phone message No  comments no voicemail  Some recent data might be hidden     Patient questions:  Do you have a fever, pain , or abdominal swelling? No. Pain Score  0 *  Have you tolerated food without any problems? Yes.    Have you been able to return to your normal activities? Yes.    Do you have any questions about your discharge instructions: Diet   No. Medications  No. Follow up visit  No.  Do you have questions or concerns about your Care? No.  Actions: * If pain score is 4 or above: No action needed, pain <4.  No problems noted per pt. maw

## 2018-04-24 ENCOUNTER — Encounter: Payer: Self-pay | Admitting: Gastroenterology

## 2018-05-02 ENCOUNTER — Telehealth: Payer: Self-pay | Admitting: Gastroenterology

## 2018-05-02 NOTE — Telephone Encounter (Signed)
I reviewed the results with the patient .  All questions answered.  She will call back for any additional questions or concerns.  

## 2020-03-03 ENCOUNTER — Encounter: Payer: Self-pay | Admitting: Gastroenterology

## 2020-03-03 ENCOUNTER — Ambulatory Visit (INDEPENDENT_AMBULATORY_CARE_PROVIDER_SITE_OTHER): Payer: Medicare Other | Admitting: Gastroenterology

## 2020-03-03 ENCOUNTER — Other Ambulatory Visit (HOSPITAL_COMMUNITY): Payer: Self-pay

## 2020-03-03 VITALS — BP 140/66 | HR 59 | Ht 70.0 in | Wt 183.0 lb

## 2020-03-03 DIAGNOSIS — R1310 Dysphagia, unspecified: Secondary | ICD-10-CM

## 2020-03-03 DIAGNOSIS — R059 Cough, unspecified: Secondary | ICD-10-CM

## 2020-03-03 DIAGNOSIS — Z86010 Personal history of colonic polyps: Secondary | ICD-10-CM

## 2020-03-03 NOTE — Patient Instructions (Signed)
You have been scheduled for a Barium Esophogram at [facility] (1st floor of the hospital) on 03/25/20  at 11:00am. Please arrive 15 minutes prior to your appointment for registration. Make certain not to have anything to eat or drink 3 hours prior to your test. If you need to reschedule for any reason, please contact radiology at [phone] to do so. __________________________________________________________________ A barium swallow is an examination that concentrates on views of the esophagus. This tends to be a double contrast exam (barium and two liquids which, when combined, create a gas to distend the wall of the oesophagus) or single contrast (non-ionic iodine based). The study is usually tailored to your symptoms so a good history is essential. Attention is paid during the study to the form, structure and configuration of the esophagus, looking for functional disorders (such as aspiration, dysphagia, achalasia, motility and reflux) EXAMINATION You may be asked to change into a gown, depending on the type of swallow being performed. A radiologist and radiographer will perform the procedure. The radiologist will advise you of the type of contrast selected for your procedure and direct you during the exam. You will be asked to stand, sit or lie in several different positions and to hold a small amount of fluid in your mouth before being asked to swallow while the imaging is performed .In some instances you may be asked to swallow barium coated marshmallows to assess the motility of a solid food bolus. The exam can be recorded as a digital or video fluoroscopy procedure. POST PROCEDURE It will take 1-2 days for the barium to pass through your system. To facilitate this, it is important, unless otherwise directed, to increase your fluids for the next 24-48hrs and to resume your normal diet.  This test typically takes about 30 minutes to  perform. __________________________________________________________________________________  Dennis Bast have been scheduled for a modified barium swallow on 03/19/20 at 1:00pm. Please arrive 15 minutes prior to your test for registration. You will go to [facility] (1st Floor) for your appointment. Should you need to cancel or reschedule your appointment, please contact [phone] [facility] [place]) or [phone]). _____________________________________________________________________ A Modified Barium Swallow Study, or MBS, is a special x-ray that is taken to check swallowing skills. It is carried out by a Stage manager and a Psychologist, clinical (SLP). During this test, yourmouth, throat, and esophagus, a muscular tube which connects your mouth to your stomach, is checked. The test will help you, your doctor, and the SLP plan what types of foods and liquids are easier for you to swallow. The SLP will also identify positions and ways to help you swallow more easily and safely. What will happen during an MBS? You will be taken to an x-ray room and seated comfortably. You will be asked to swallow small amounts of food and liquid mixed with barium. Barium is a liquid or paste that allows images of your mouth, throat and esophagus to be seen on x-ray. The x-ray captures moving images of the food you are swallowing as it travels from your mouth through your throat and into your esophagus. This test helps identify whether food or liquid is entering your lungs (aspiration). The test also shows which part of your mouth or throat lacks strength or coordination to move the food or liquid in the right direction. This test typically takes 30 minutes to 1 hour to complete.   Take your Dicyclomine 10mg   -Three times daily to help with IBS.   If you  are age 52 or older, your body mass index should be between 23-30. Your Body mass index is 26.26 kg/m. If this is out of the aforementioned range  listed, please consider follow up with your Primary Care Provider.  If you are age 20 or younger, your body mass index should be between 19-25. Your Body mass index is 26.26 kg/m. If this is out of the aformentioned range listed, please consider follow up with your Primary Care Provider.   Thank you for choosing me and [facility].  Pricilla Riffle. Dagoberto Ligas., MD., [facility]

## 2020-03-03 NOTE — Progress Notes (Signed)
    History of Present Illness: This is an 85 year old female here for worsening dysphagia. She is accompanied by her daughter. She was evaluated in Jan 2019 for IBS-D and dysphagia since her CVA. Evaluation as below. She relates dysphagia with liquids causing choking and coughing. She notes difficulty swallowing large pills. She notes difficulty intermittently with solids.  Dysphagia symptoms seem to have worsened in frequency and severity over the past year.  Post prandial diarrhea with urgency continues. Denies weight loss, abdominal pain, constipation, change in stool caliber, melena, hematochezia, nausea, vomiting, reflux symptoms, chest pain.   MBSS Jan 2019  Essentially normal with occasional flash penetration.  BA esophagram Jan 2019 nonspecific esophageal motility disorder  Colonoscopy Nov 2019 - Four 6 to 8 mm polyps in the sigmoid colon, in the descending colon and in the transverse colon, removed with a cold snare. Resected and retrieved. - Patent end-to-end colo-colonic anastomosis, characterized by healthy appearing mucosa. - Mild diverticulosis in the left colon. - Internal hemorrhoids. - The examination was otherwise normal on direct and retroflexion views. Path: tubular adenoma  Current Medications, Allergies, Past Medical History, Past Surgical History, Family History and Social History were reviewed in [employer] record.   Physical Exam: General: Well developed, well nourished, no acute distress Head: Normocephalic and atraumatic Eyes:  sclerae anicteric, EOMI Ears: Normal auditory acuity Mouth: Not examined, mask on during Covid-19 pandemic Lungs: Clear throughout to auscultation Heart: Regular rate and rhythm; no murmurs, rubs or bruits Abdomen: Soft, non tender and non distended. No masses, hepatosplenomegaly or hernias noted. Normal Bowel sounds Rectal: Not done Musculoskeletal: Symmetrical with no gross deformities  Pulses:  Normal  pulses noted Extremities: No clubbing, cyanosis, edema or deformities noted Neurological: Alert oriented x 4, grossly nonfocal Psychological:  Alert and cooperative. Normal mood and affect   Assessment and Recommendations:  1. Dysphagia to liquids, solids, pills. Suspected oropharyngeal and esophageal dysmotility as found on prior evaluation. Schedule MBSS and barium esophagram.  If an esophageal stricture or other esophagram abnormality indicates we will proceed with EGD for further evaluation.  2. IBS-D. Dicyclomine 10 mg po tid ac (not prn). Librax works better for her however it is not affordable and she requests an alternative.   3. Personal history of adenomatous colon polyps, one requiring piecemeal polypectomy and one requiring a left segmental colectomy.  Last colonoscopy in 2019 as above. No plans for future surveillance colonoscopies due to age and comorbidities..  4.  History of CVA maintained on Plavix.  Although a procedure was not scheduled today we  discussed in detail the risk benefits and alternatives to holding Plavix for 5 days prior to the procedure and that we would obtain clearance from her prescribing physician if we proceed with any procedure.

## 2020-03-18 ENCOUNTER — Ambulatory Visit (HOSPITAL_COMMUNITY): Payer: Medicare Other

## 2020-03-19 ENCOUNTER — Other Ambulatory Visit (HOSPITAL_COMMUNITY): Payer: Medicare Other

## 2020-03-19 ENCOUNTER — Ambulatory Visit (HOSPITAL_COMMUNITY): Payer: Medicare Other

## 2020-03-19 ENCOUNTER — Encounter (HOSPITAL_COMMUNITY): Payer: Medicare Other

## 2020-04-07 ENCOUNTER — Other Ambulatory Visit: Payer: Self-pay

## 2020-04-07 ENCOUNTER — Ambulatory Visit (HOSPITAL_COMMUNITY)
Admission: RE | Admit: 2020-04-07 | Discharge: 2020-04-07 | Disposition: A | Discharge status: Home / Self Care | Payer: Medicare Other | Source: Ambulatory Visit | Attending: Gastroenterology | Admitting: Gastroenterology

## 2020-04-07 DIAGNOSIS — Z86010 Personal history of colonic polyps: Secondary | ICD-10-CM

## 2020-04-07 DIAGNOSIS — R1310 Dysphagia, unspecified: Secondary | ICD-10-CM

## 2020-04-07 DIAGNOSIS — R059 Cough, unspecified: Secondary | ICD-10-CM

## 2022-03-26 IMAGING — RF DG ESOPHAGUS
11 of 13 series · 13 of 24 positions shown · non-contrast
Comparison: 06/08/2017 esophagram.

CLINICAL DATA: Food sticking in the throat with episodic choking
and pill dysphagia.

EXAM:
ESOPHOGRAM / BARIUM SWALLOW / BARIUM TABLET STUDY
TECHNIQUE: Combined double contrast and single contrast examination performed
using effervescent crystals, thick barium liquid, and thin barium
liquid. The patient was observed with fluoroscopy swallowing a 13 mm
barium sulphate tablet.
FLUOROSCOPY TIME:  Fluoroscopy Time:  2 minutes 30 seconds
Radiation Exposure Index (if provided by the fluoroscopic device):
44.5 mGy
Number of Acquired Spot Images: 4

[Series 1: cp_standard · 0.35mm/px · 1 of 74 frames shown (1 of 10)]
[frame 12/74]
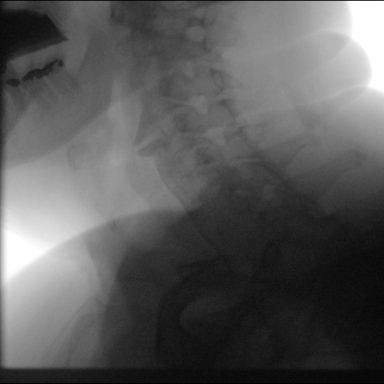

[Series 2: cp_standard · 0.35mm/px · 2 of 48 frames shown (2 of 10)]
[frame 1/48]
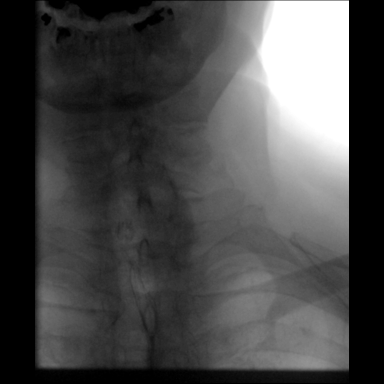
[frame 41/48]
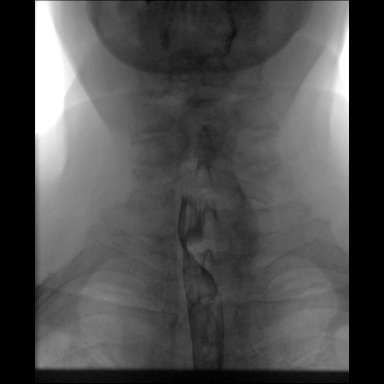

[Series 3: cp_standard · 0.34mm/px · 1 of 109 frames shown (3 of 10)]
[frame 93/109]
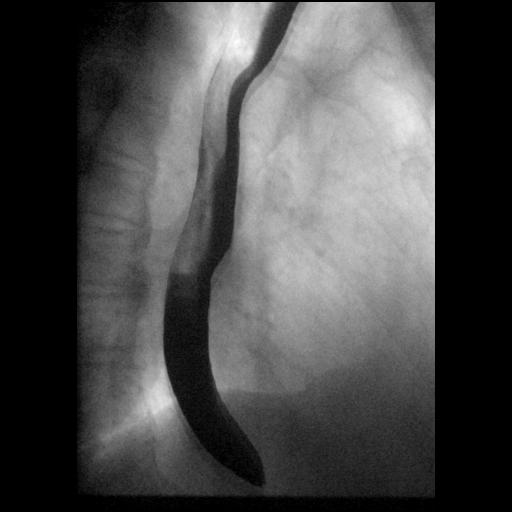

[Series 6: fluoro_barium 2fps_bw · 0.17mm/px · 1 of 2 frames shown]
[frame 1/2]
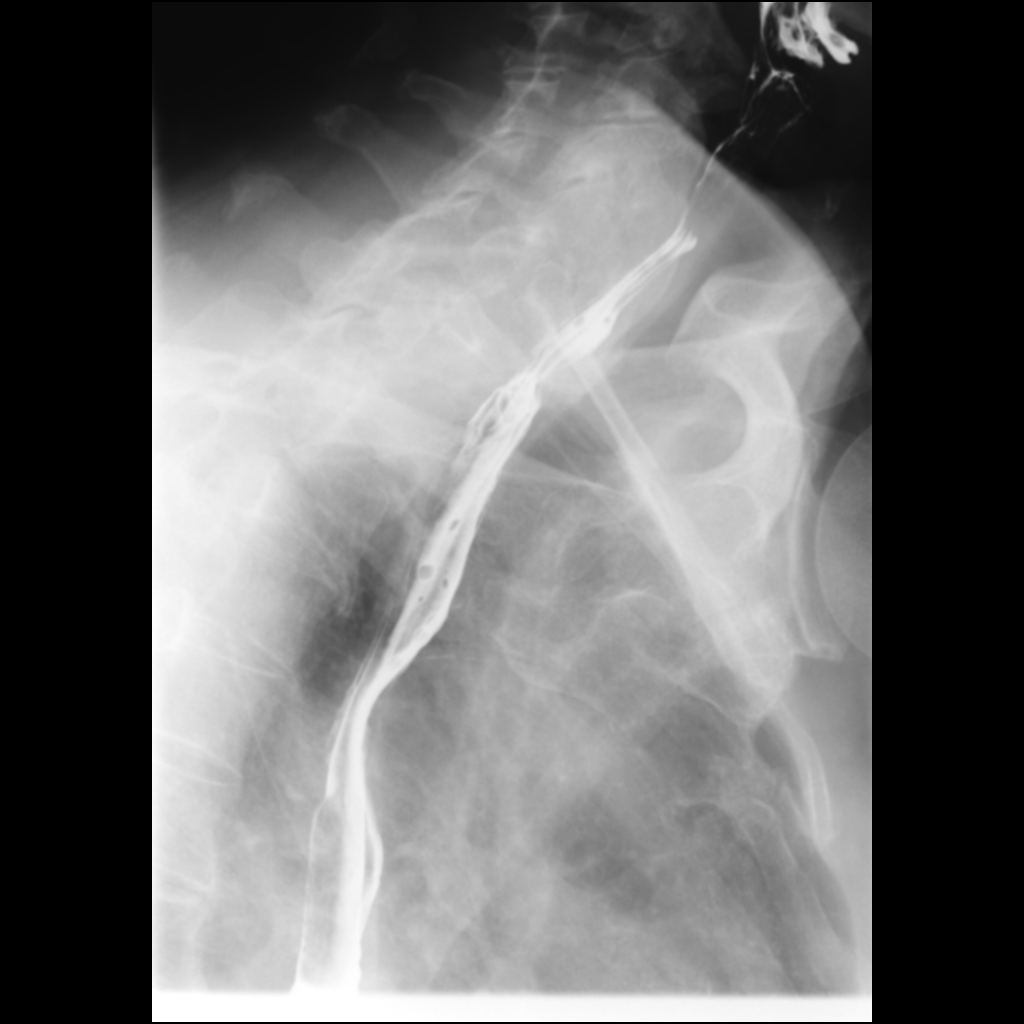

[Series 8: cp_standard · 0.34mm/px · 1 of 58 frames shown (4 of 10)]
[frame 30/58]
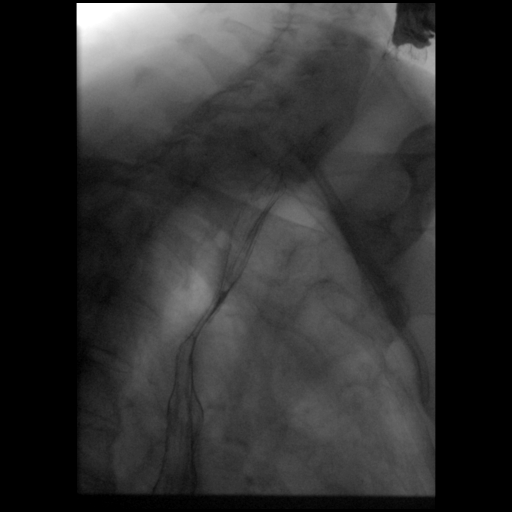

[Series 9: cp_standard · 0.53mm/px · 2 of 220 frames shown (5 of 10)]
[frame 34/220]
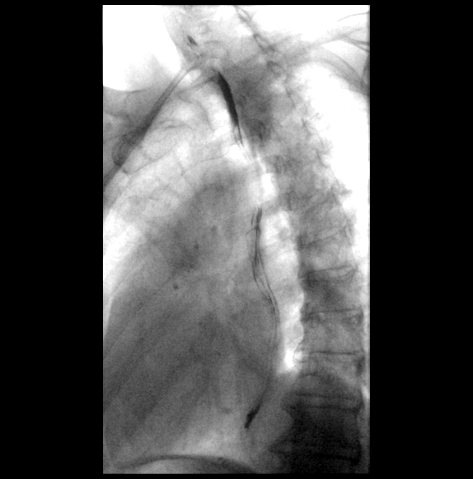
[frame 111/220]
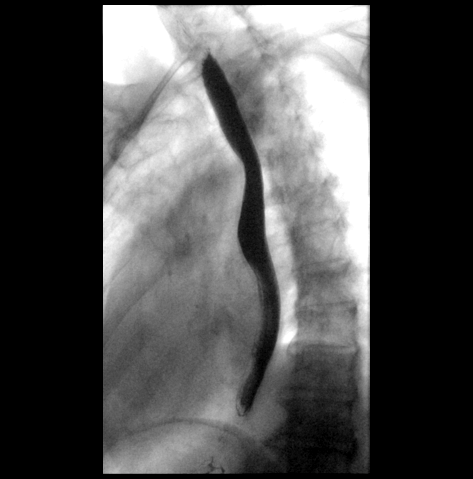

[Series 10: cp_standard · 0.36mm/px · 1 of 327 frames shown (6 of 10)]
[frame 164/327]
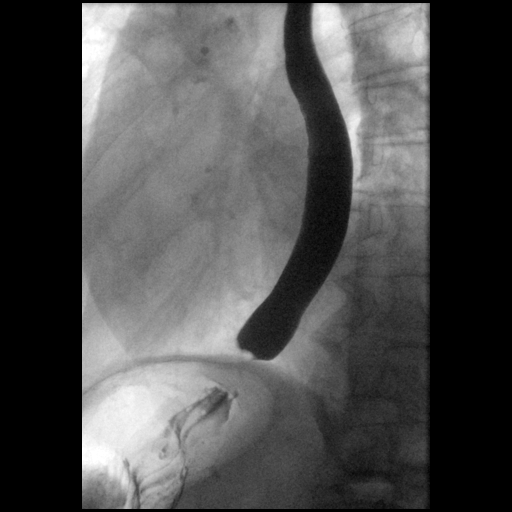

[Series 11: cp_standard · 0.36mm/px · 1 of 124 frames shown (7 of 10)]
[frame 62/124]
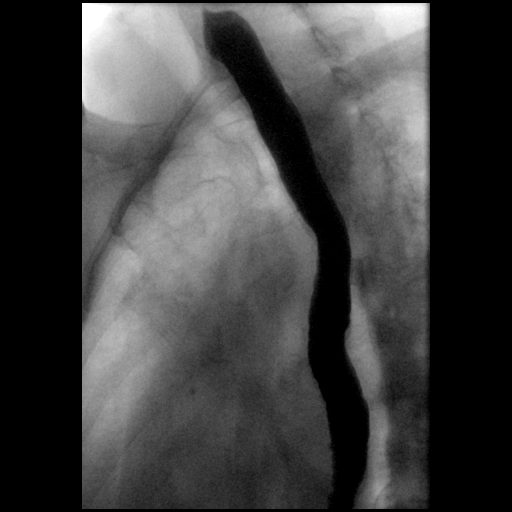

[Series 12: cp_standard · 0.54mm/px · 1 of 238 frames shown (8 of 10)]
[frame 79/238]
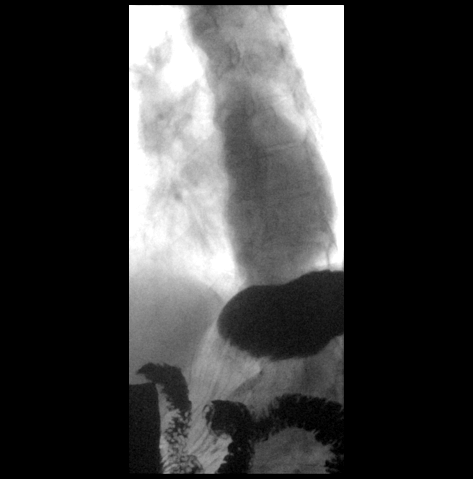

[Series 13: cp_standard · 0.54mm/px · 1 of 110 frames shown (9 of 10)]
[frame 17/110]
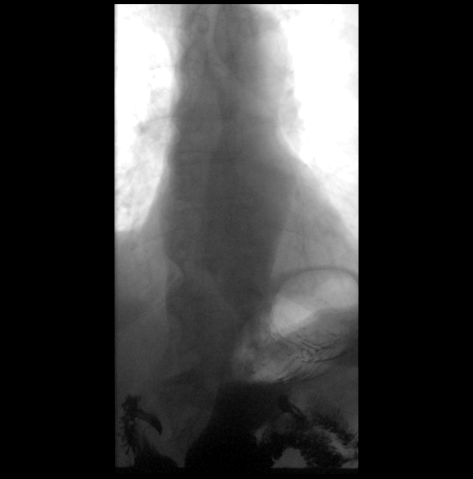

[Series 14: cp_standard · 0.27mm/px · 1 of 1 slices shown (10 of 10)]
[im 1/1]
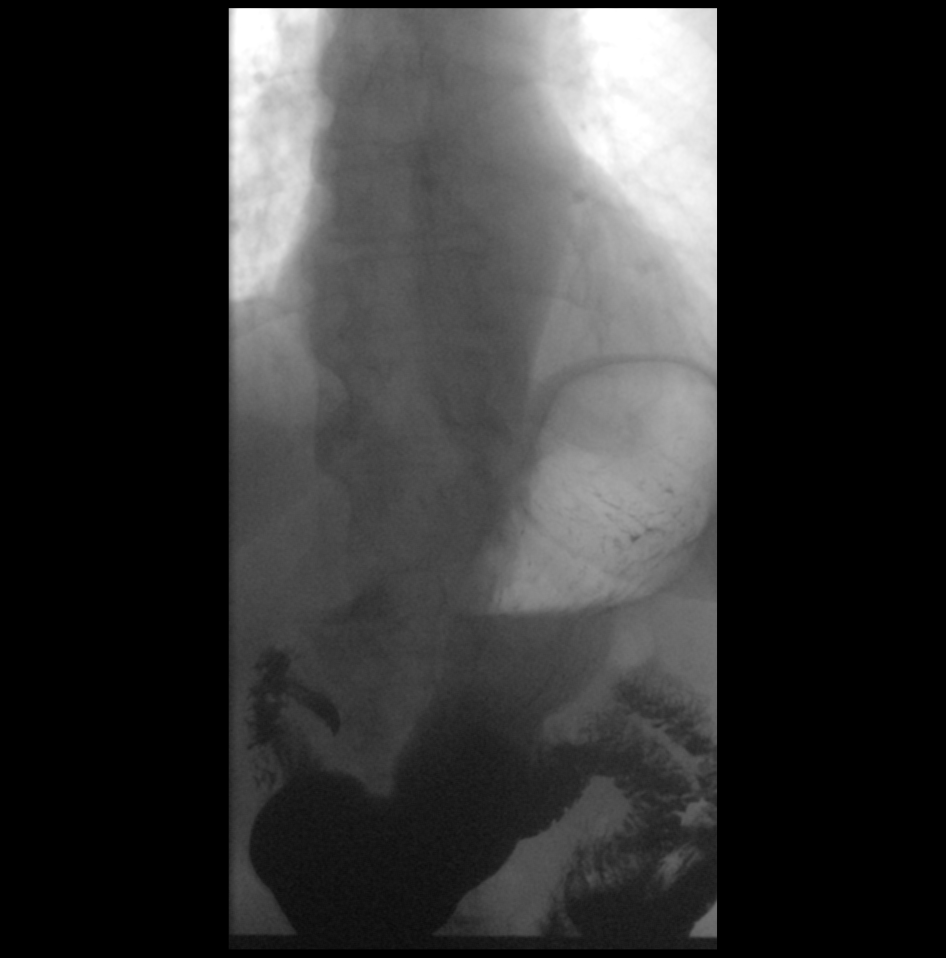

[13 of 24 positions shown; findings below may reference images not displayed]

FINDINGS: Normal oral and pharyngeal phases of swallowing, with no laryngeal
penetration or tracheobronchial aspiration. No significant barium
retention in the pharynx. No evidence of pharyngeal mass, stricture
or diverticulum. No significant cricopharyngeus muscle dysfunction.

Mild esophageal dysmotility, characterized by intermittent mild
weakening of primary peristalsis in the mid to lower thoracic
esophagus. No hiatal hernia. No gastroesophageal reflux elicited,
despite provocative maneuvers including water siphon test and
Valsalva maneuver. Normal esophageal mucosa, with no evidence of
reflux esophagitis. Normal esophageal distensibility, with no
evidence of esophageal mass, ulcer or stricture. Barium tablet
traversed the esophagus into the stomach without delay.
IMPRESSION: 1. Mild esophageal dysmotility, nonspecific.
2. Otherwise normal esophagram. No hiatal hernia. No
gastroesophageal reflux elicited.
